# Patient Record
Sex: Female | Born: 1985 | Hispanic: Yes | Marital: Single | Smoking: Never smoker
Health system: Southern US, Community
[De-identification: ages and names within clinical notes are randomized; demographics above are authoritative.]

## PROBLEM LIST (undated history)

## (undated) DIAGNOSIS — M419 Scoliosis, unspecified: Secondary | ICD-10-CM

---

## 2017-04-22 ENCOUNTER — Encounter (HOSPITAL_COMMUNITY): Payer: Self-pay | Admitting: Emergency Medicine

## 2017-04-22 ENCOUNTER — Emergency Department (HOSPITAL_COMMUNITY): Payer: Self-pay

## 2017-04-22 ENCOUNTER — Emergency Department (HOSPITAL_COMMUNITY)
Admission: EM | Admit: 2017-04-22 | Discharge: 2017-04-23 | Disposition: A | Discharge status: Home / Self Care | Payer: Self-pay | Attending: Emergency Medicine | Admitting: Emergency Medicine

## 2017-04-22 DIAGNOSIS — R0789 Other chest pain: Secondary | ICD-10-CM

## 2017-04-22 DIAGNOSIS — R05 Cough: Secondary | ICD-10-CM | POA: Insufficient documentation

## 2017-04-22 DIAGNOSIS — R0602 Shortness of breath: Secondary | ICD-10-CM | POA: Insufficient documentation

## 2017-04-22 HISTORY — DX: Scoliosis, unspecified: M41.9

## 2017-04-22 LAB — CBC
HCT: 33 % — ABNORMAL LOW (ref 36.0–46.0)
HEMOGLOBIN: 11 g/dL — AB (ref 12.0–15.0)
MCH: 29.4 pg (ref 26.0–34.0)
MCHC: 33.3 g/dL (ref 30.0–36.0)
MCV: 88.2 fL (ref 78.0–100.0)
PLATELETS: 343 10*3/uL (ref 150–400)
RBC: 3.74 MIL/uL — ABNORMAL LOW (ref 3.87–5.11)
RDW: 12.3 % (ref 11.5–15.5)
WBC: 9.8 10*3/uL (ref 4.0–10.5)

## 2017-04-22 LAB — BASIC METABOLIC PANEL
ANION GAP: 5 (ref 5–15)
BUN: 15 mg/dL (ref 6–20)
CALCIUM: 8.8 mg/dL — AB (ref 8.9–10.3)
CO2: 23 mmol/L (ref 22–32)
CREATININE: 0.63 mg/dL (ref 0.44–1.00)
Chloride: 107 mmol/L (ref 101–111)
GFR calc non Af Amer: >60 mL/min (ref >60)
Glucose, Bld: 94 mg/dL (ref 65–99)
Potassium: 3.3 mmol/L — ABNORMAL LOW (ref 3.5–5.1)
SODIUM: 135 mmol/L (ref 135–145)

## 2017-04-22 LAB — I-STAT TROPONIN, ED: TROPONIN I, POC: 0 ng/mL (ref 0.00–0.08)

## 2017-04-22 MED ORDER — POTASSIUM CHLORIDE CRYS ER 20 MEQ PO TBCR
40.0000 meq | EXTENDED_RELEASE_TABLET | Freq: Once | ORAL | Status: AC
  Administered 2017-04-23: 40 meq via ORAL
  Filled 2017-04-22: qty 2

## 2017-04-22 MED ORDER — MORPHINE SULFATE (PF) 4 MG/ML IV SOLN
2.0000 mg | Freq: Once | INTRAVENOUS | Status: AC
  Administered 2017-04-22: 2 mg via INTRAVENOUS
  Filled 2017-04-22: qty 1

## 2017-04-22 NOTE — ED Triage Notes (Signed)
Pt from work w/ c/o CP that got worse around 1830. Pt sts pain has been intermittent for past 15 days. Took 324 ASA PTA and rates pain @ 6/10 w/ no radiation. Endorses SHOB, pain worse on inspiration & diaphoresis but no other s/s. Denies any hx. A&O x4 on arrival. Pt speaks spanish, interpreter used.

## 2017-04-22 NOTE — ED Provider Notes (Signed)
[facility]-EMERGENCY DEPT Provider Note   CSN: [id number] Arrival date & time: 04/22/17  2033     History   Chief Complaint Chief Complaint  Patient presents with  . Chest Pain    HPI Kathryn Pierce is a 30 y.o. female with history of scoliosis who presents today with chief complaint acute onset, progressively improving chest pain. She states that pain began at around 7:30 this evening while she was walking. Pain was described as severe sharp pressure with radiation down the left arm. She also endorses increased heart rate and sensation of palpitations as well as diaphoresis and this time. She also endorses shortness of breath which has resolved. She states that she has had episodes like this in the past but not quite as severe. She denies excessive caffeine intake, smoking, or recreational drug use. She denies fevers, chills, lightheadedness, abdominal pain, nausea, or vomiting. She states pain worsened somewhat earlier today when she was laying back. Denies hemoptysis, recent travel or surgeries, prior history of DVT or PE, history of cancer, or OCPs. Pain in the chest has improved somewhat but is still present.  She is Spanish-speaking primarily and age-related was used to obtain history and physical examination.   The history is provided by the patient.    Past Medical History:  Diagnosis Date  . Scoliosis     There are no active problems to display for this patient.   History reviewed. No pertinent surgical history.  OB History    No data available       Home Medications    Prior to Admission medications   Not on File    Family History History reviewed. No pertinent family history.  Social History Social History  Substance Use Topics  . Smoking status: Never Smoker  . Smokeless tobacco: Never Used  . Alcohol use Not on file     Allergies   Patient has no known allergies.   Review of Systems Review of Systems  Constitutional: Positive for diaphoresis.  Negative for chills and fever.  Respiratory: Positive for cough and shortness of breath.   Cardiovascular: Positive for chest pain and palpitations. Negative for leg swelling.  Gastrointestinal: Negative for abdominal pain, diarrhea, nausea and vomiting.  Neurological: Negative for syncope and light-headedness.     Physical Exam Updated Vital Signs BP 105/62   Pulse 78   Temp 98.9 F (37.2 C) (Oral)   Resp 18   Ht 5' 0.63" (1.54 m)   Wt 52 kg (114 lb 10.2 oz)   LMP 03/26/2017 (Exact Date)   SpO2 100%   BMI 21.93 kg/m   Physical Exam  Constitutional: She appears well-developed and well-nourished. No distress.  HENT:  Head: Normocephalic and atraumatic.  Right Ear: External ear normal.  Left Ear: External ear normal.  Eyes: Conjunctivae are normal. Right eye exhibits no discharge. Left eye exhibits no discharge.  Neck: Normal range of motion. Neck supple. No JVD present. No tracheal deviation present.  Cardiovascular: Normal rate, regular rhythm, normal heart sounds and intact distal pulses.  Exam reveals no gallop and no friction rub.   No murmur heard. 2+ radial and DP/PT pulses bl, negative Homan's bl, no lower extremity edema  Pulmonary/Chest: Effort normal and breath sounds normal. No respiratory distress. She has no wheezes. She has no rales. She exhibits tenderness.  Left anterior chest wall tender to palpation, no crepitus, or paradoxical wall motion, ecchymosis, or deformity. Equal rise and fall of chest, no increased work of breathing  Abdominal: Soft.  Bowel sounds are normal. She exhibits no distension. There is no tenderness.  Musculoskeletal: She exhibits no edema or tenderness.  Obvious thoracic scoliosis to the right, no TTP of the spine or paraspinal musculature posteriorly  Neurological: She is alert.  Skin: Skin is warm and dry. Capillary refill takes less than 2 seconds. No erythema.  Psychiatric: She has a normal mood and affect. Her behavior is normal.    Nursing note and vitals reviewed.    ED Treatments / Results  Labs (all labs ordered are listed, but only abnormal results are displayed) Labs Reviewed  BASIC METABOLIC PANEL - Abnormal; Notable for the following:       Result Value   Potassium 3.3 (*)    Calcium 8.8 (*)    All other components within normal limits  CBC - Abnormal; Notable for the following:    RBC 3.74 (*)    Hemoglobin 11.0 (*)    HCT 33.0 (*)    All other components within normal limits  I-STAT TROPONIN, ED  I-STAT TROPONIN, ED    EKG  EKG Interpretation  Date/Time:  Thursday April 22 2017 20:34:01 EDT Ventricular Rate:  73 PR Interval:    QRS Duration: 104 QT Interval:  373 QTC Calculation: 411 R Axis:   82 Text Interpretation:  Sinus rhythm Low voltage, precordial leads RSR' in V1 or V2, probably normal variant agree. no old comparison Confirmed by Arby Barrette [phone]) on 04/22/2017 11:03:48 PM       Radiology Dg Chest 2 View  Result Date: 04/22/2017 CLINICAL DATA:  Chest pain for 15 days, worse today. Pain is worse with inspiration. Nonsmoker. EXAM: CHEST  2 VIEW COMPARISON:  None. FINDINGS: Severe thoracolumbar scoliosis convex towards the right in the upper thoracic spine and towards the left in the thoracolumbar spine. Normal heart size and pulmonary vascularity. No focal airspace disease or consolidation in the lungs. No blunting of costophrenic angles. No pneumothorax. Mediastinal contours appear intact. IMPRESSION: No active cardiopulmonary disease. Electronically Signed   By: Burman Nieves M.D.   On: 04/22/2017 21:56    Procedures Procedures (including critical care time)  Medications Ordered in ED Medications  morphine 4 MG/ML injection 2 mg (2 mg Intravenous Given 04/22/17 2221)  potassium chloride SA (K-DUR,KLOR-CON) CR tablet 40 mEq (40 mEq Oral Given 04/23/17 0010)     Initial Impression / Assessment and Plan / ED Course  I have reviewed the triage vital signs and the  nursing notes.  Pertinent labs & imaging results that were available during my care of the patient were reviewed by me and considered in my medical decision making (see chart for details).     Patient with chest pain which began earlier today, reproducible on palpation. Afebrile, vital signs are stable. Chest x-ray with no active cardiopulmonary disease. Low suspicion of pneumonia given clear breath sounds bilaterally and no fever or productive cough. She is PERC negative and I have a low suspicion of PE. EKG shows no ST segment abnormality or arrhythmia. Serial troponins negative. I doubt ACS or MI contributing to her symptoms. No evidence of pericarditis, myocarditis, pulmonary edema, or bronchitis. There may be a musculoskeletal component to her pain as she is under to palpation in the left anterior chest wall. No evidence of rib fractures. She does do heavy lifting at work. Stable for discharge home with NSAIDs, ice, heat, and other symptomatic therapy. Discussed indications for return to the ED. She will follow-up with primary care physician for reevaluation. Pain  managed while in the ED. she is requesting referral to neurosurgery for evaluation of her scoliosis, which is significant and for which she has told she will require surgery in the past. Pt verbalized understanding of and agreement with plan and is safe for discharge home at this time.   Final Clinical Impressions(s) / ED Diagnoses   Final diagnoses:  Chest wall pain    New Prescriptions New Prescriptions   No medications on file     Bennye Alm 04/23/17 0136    Arby Barrette, MD 04/27/17 1601

## 2017-04-23 LAB — I-STAT TROPONIN, ED: Troponin i, poc: 0 ng/mL (ref 0.00–0.08)

## 2017-04-23 NOTE — ED Notes (Signed)
ED Provider at bedside. 

## 2017-04-23 NOTE — Discharge Instructions (Signed)
Alternate 600 mg of ibuprofen and [phone] mg of Tylenol every 3 hours as needed for pain. Do not exceed 4000 mg of Tylenol daily. Apply ice or heat to the chest for comfort. Follow up with a primary care physician for reevaluation of your symptoms. I have attached information for the neurosurgeon on-call, but you may follow up with whomever you want for evaluation of your scoliosis. Return to the ED immediately if any concerning signs or symptoms develop such as worsening chest pain, shortness of breath, fevers, or coughing up blood.

## 2019-04-03 IMAGING — CR DG CHEST 2V
2 series · 2 of 2 positions shown · non-contrast
Comparison: None.

CLINICAL DATA: Chest pain for 15 days, worse today. Pain is worse
with inspiration. Nonsmoker.

EXAM:
CHEST  2 VIEW

[chest lat]
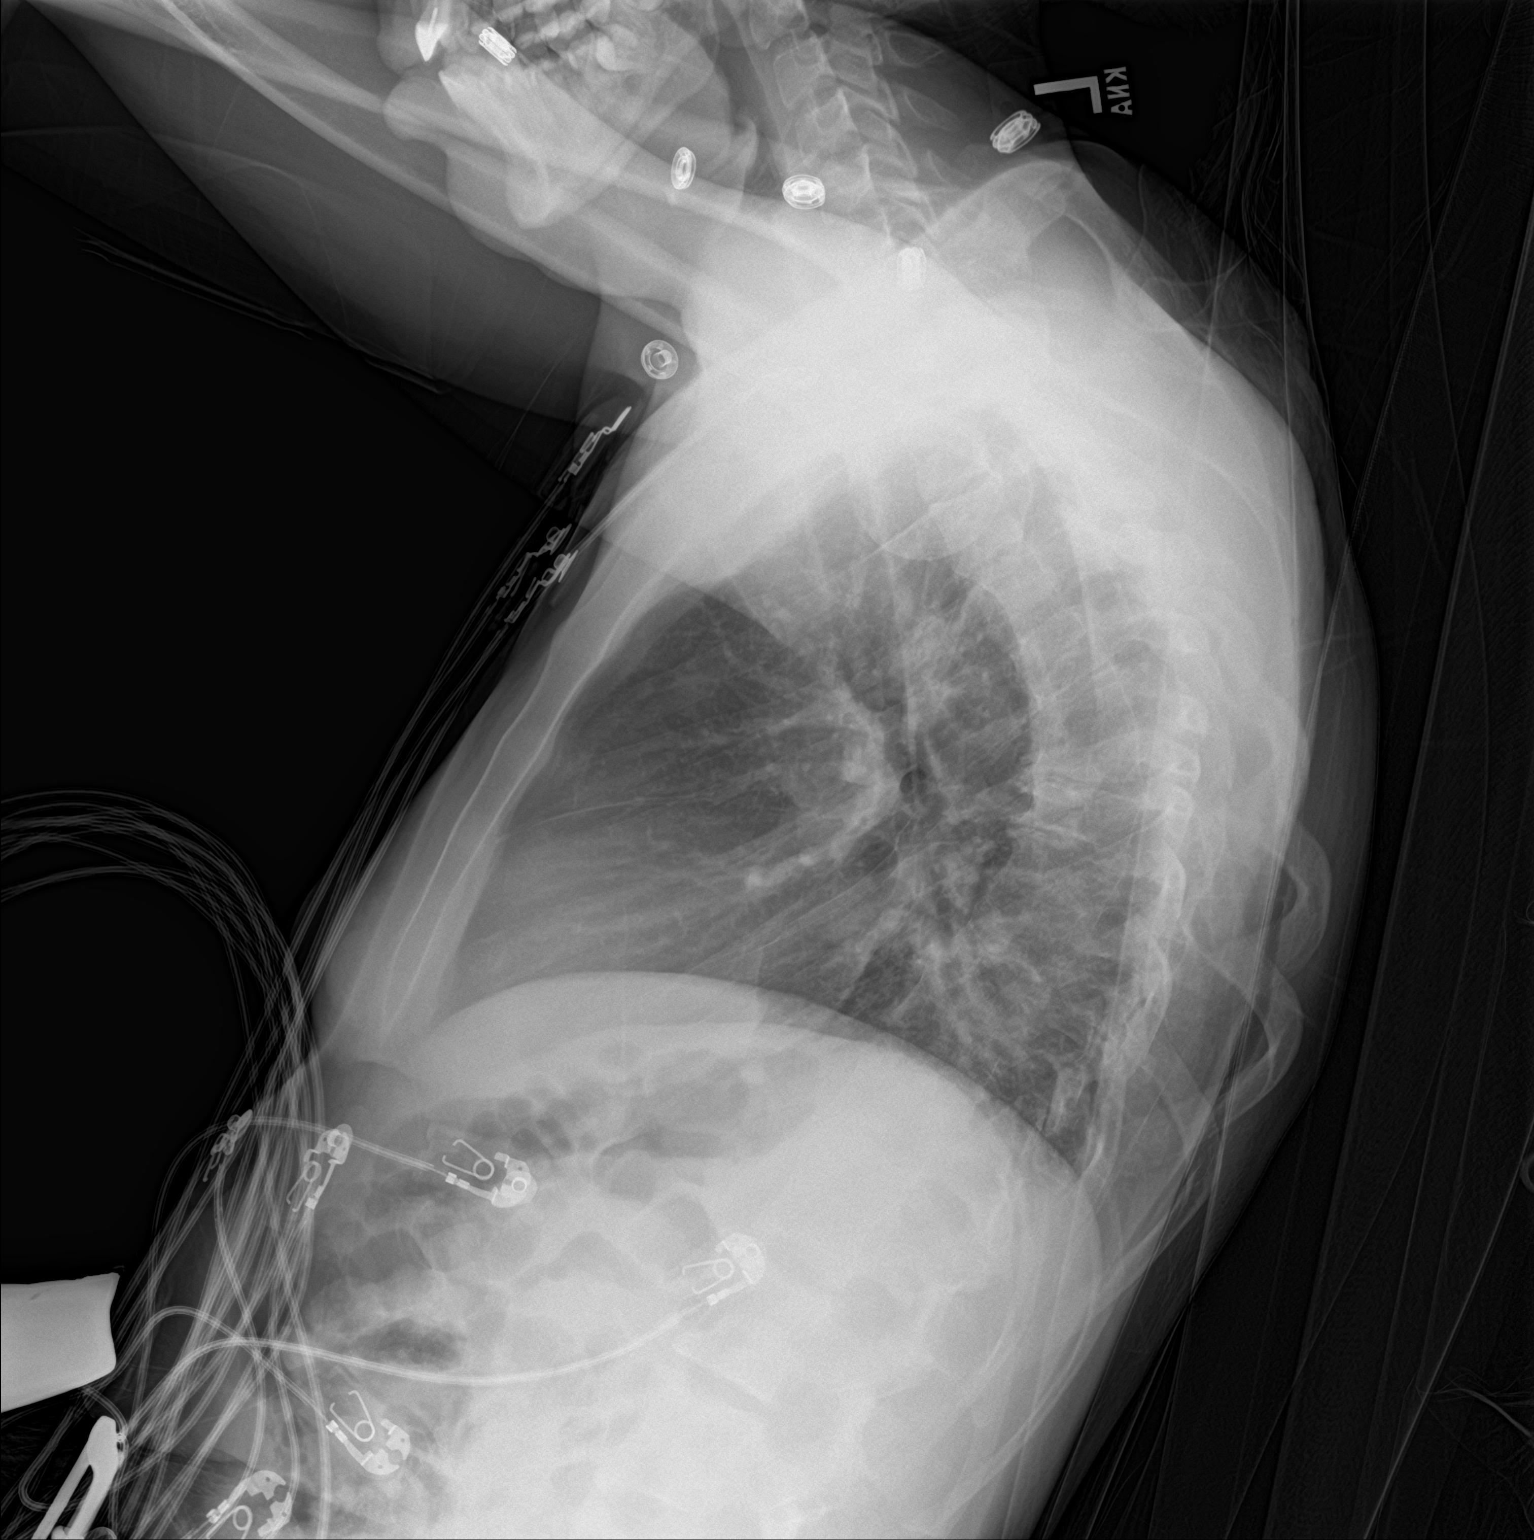

[chest ap]
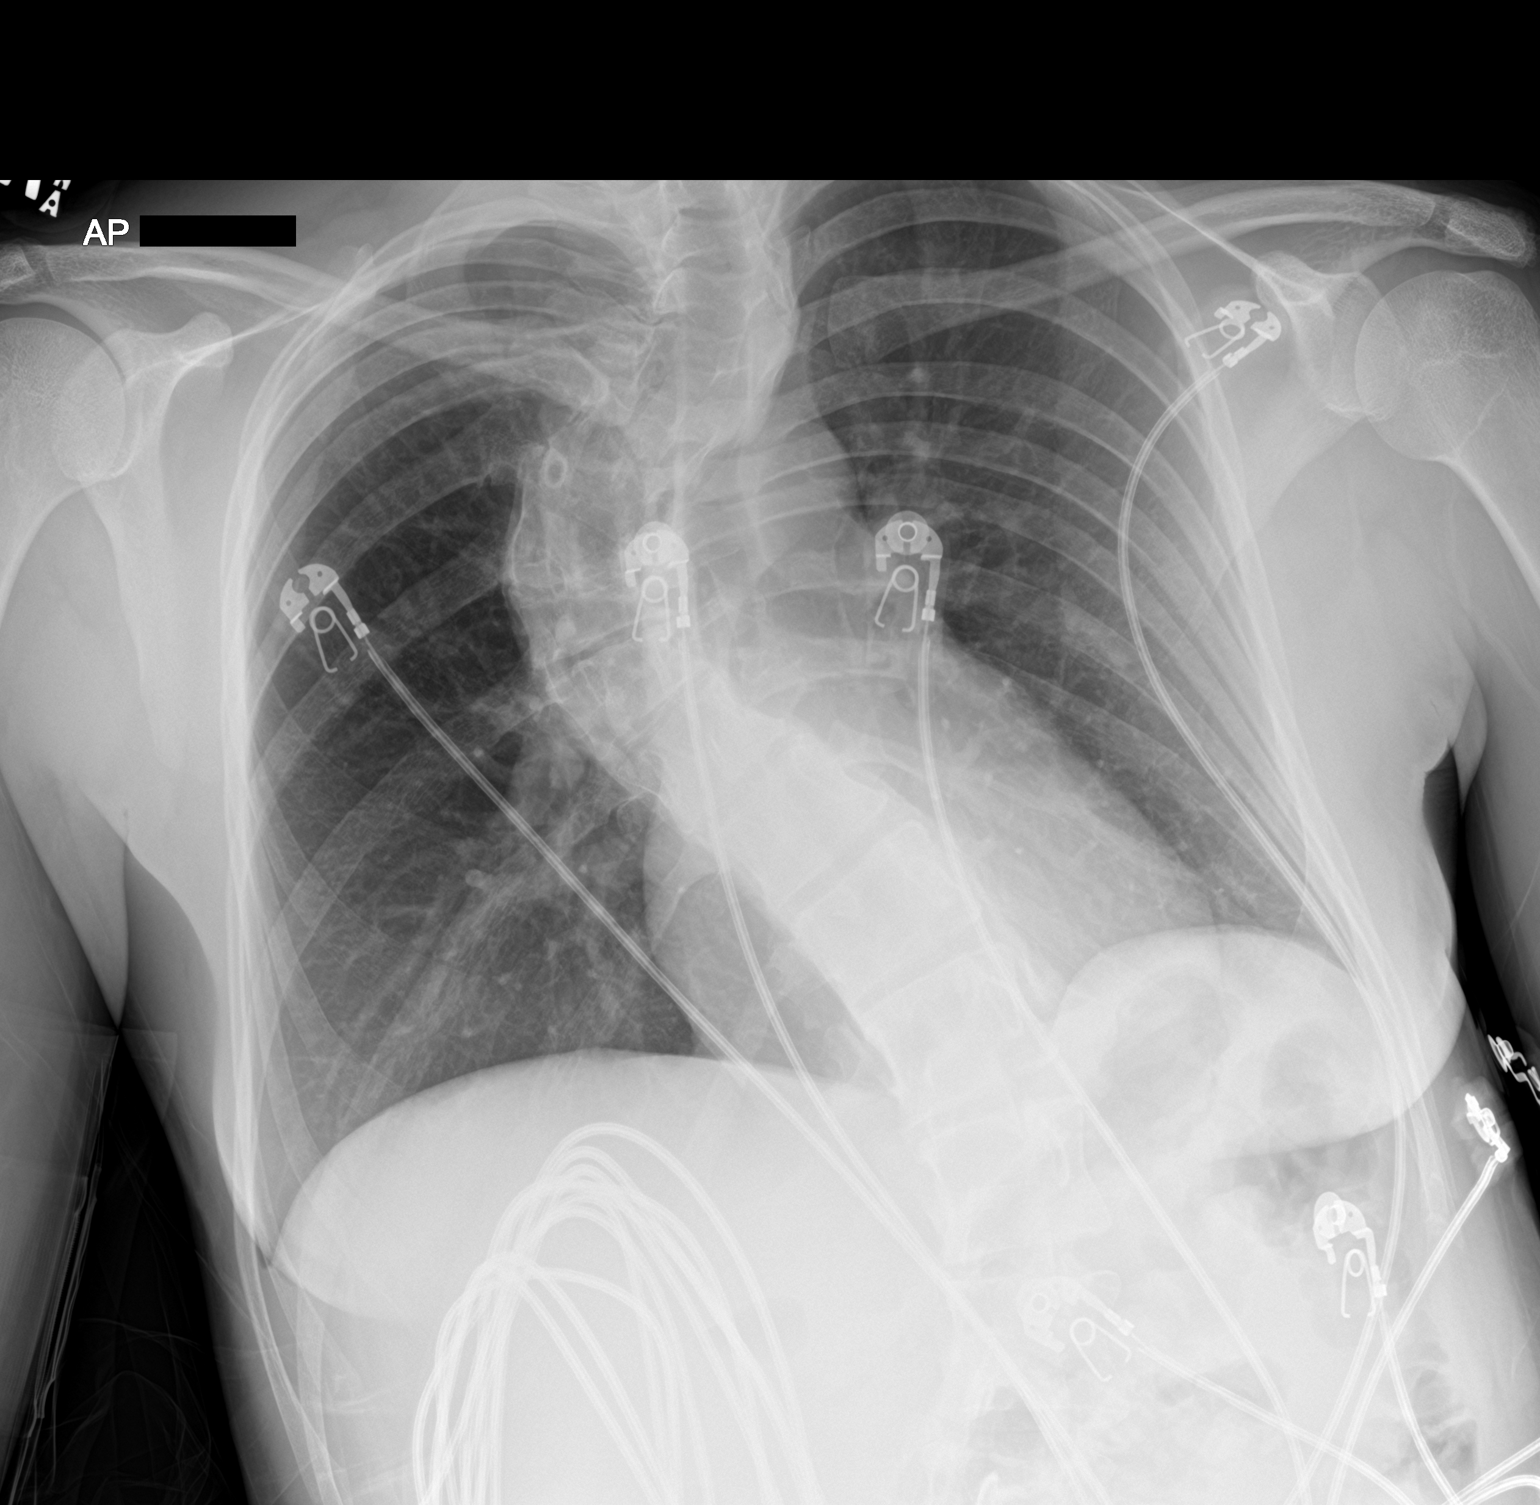

[2 of 2 positions shown; findings below may reference images not displayed]

FINDINGS: Severe thoracolumbar scoliosis convex towards the right in the upper
thoracic spine and towards the left in the thoracolumbar spine.
Normal heart size and pulmonary vascularity. No focal airspace
disease or consolidation in the lungs. No blunting of costophrenic
angles. No pneumothorax. Mediastinal contours appear intact.
IMPRESSION: No active cardiopulmonary disease.
